# Patient Record
Sex: Male | Born: 1985 | Race: Black or African American | Hispanic: No | Marital: Single | Smoking: Current every day smoker
Health system: Southern US, Community
[De-identification: ages and names within clinical notes are randomized; demographics above are authoritative.]

## PROBLEM LIST (undated history)

## (undated) DIAGNOSIS — J939 Pneumothorax, unspecified: Secondary | ICD-10-CM

## (undated) DIAGNOSIS — T148XXA Other injury of unspecified body region, initial encounter: Secondary | ICD-10-CM

## (undated) DIAGNOSIS — W3400XA Accidental discharge from unspecified firearms or gun, initial encounter: Secondary | ICD-10-CM

## (undated) DIAGNOSIS — Y249XXA Unspecified firearm discharge, undetermined intent, initial encounter: Secondary | ICD-10-CM

---

## 2013-01-31 ENCOUNTER — Emergency Department (HOSPITAL_COMMUNITY): Payer: Self-pay

## 2013-01-31 ENCOUNTER — Emergency Department (HOSPITAL_COMMUNITY)
Admission: EM | Admit: 2013-01-31 | Discharge: 2013-01-31 | Disposition: A | Discharge status: Home / Self Care | Payer: Self-pay | Attending: Emergency Medicine | Admitting: Emergency Medicine

## 2013-01-31 ENCOUNTER — Encounter (HOSPITAL_COMMUNITY): Payer: Self-pay | Admitting: Emergency Medicine

## 2013-01-31 DIAGNOSIS — IMO0002 Reserved for concepts with insufficient information to code with codable children: Secondary | ICD-10-CM | POA: Insufficient documentation

## 2013-01-31 DIAGNOSIS — Z8709 Personal history of other diseases of the respiratory system: Secondary | ICD-10-CM | POA: Insufficient documentation

## 2013-01-31 DIAGNOSIS — W540XXA Bitten by dog, initial encounter: Secondary | ICD-10-CM | POA: Insufficient documentation

## 2013-01-31 DIAGNOSIS — S61509A Unspecified open wound of unspecified wrist, initial encounter: Secondary | ICD-10-CM | POA: Insufficient documentation

## 2013-01-31 DIAGNOSIS — S41009A Unspecified open wound of unspecified shoulder, initial encounter: Secondary | ICD-10-CM | POA: Insufficient documentation

## 2013-01-31 DIAGNOSIS — F17200 Nicotine dependence, unspecified, uncomplicated: Secondary | ICD-10-CM | POA: Insufficient documentation

## 2013-01-31 DIAGNOSIS — Y9389 Activity, other specified: Secondary | ICD-10-CM | POA: Insufficient documentation

## 2013-01-31 DIAGNOSIS — Z87828 Personal history of other (healed) physical injury and trauma: Secondary | ICD-10-CM | POA: Insufficient documentation

## 2013-01-31 DIAGNOSIS — Z23 Encounter for immunization: Secondary | ICD-10-CM | POA: Insufficient documentation

## 2013-01-31 DIAGNOSIS — F911 Conduct disorder, childhood-onset type: Secondary | ICD-10-CM | POA: Insufficient documentation

## 2013-01-31 DIAGNOSIS — Y9289 Other specified places as the place of occurrence of the external cause: Secondary | ICD-10-CM | POA: Insufficient documentation

## 2013-01-31 HISTORY — DX: Pneumothorax, unspecified: J93.9

## 2013-01-31 HISTORY — DX: Other injury of unspecified body region, initial encounter: T14.8XXA

## 2013-01-31 HISTORY — DX: Accidental discharge from unspecified firearms or gun, initial encounter: W34.00XA

## 2013-01-31 HISTORY — DX: Unspecified firearm discharge, undetermined intent, initial encounter: Y24.9XXA

## 2013-01-31 MED ORDER — AMOXICILLIN-POT CLAVULANATE 875-125 MG PO TABS: 1.0000 | ORAL_TABLET | Freq: Two times a day (BID) | ORAL | Status: AC

## 2013-01-31 MED ORDER — TETANUS-DIPHTH-ACELL PERTUSSIS 5-2.5-18.5 LF-MCG/0.5 IM SUSP
0.5000 mL | Freq: Once | INTRAMUSCULAR | Status: AC
  Administered 2013-01-31: 0.5 mL via INTRAMUSCULAR
  Filled 2013-01-31: qty 0.5
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
## 2013-01-31 NOTE — ED Provider Notes (Signed)
Medical screening examination/treatment/procedure(s) were performed by non-physician practitioner and as supervising physician I was immediately available for consultation/collaboration.  Gilda Crease, MD 01/31/13 [phone]
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
## 2021-02-27 ENCOUNTER — Encounter (HOSPITAL_BASED_OUTPATIENT_CLINIC_OR_DEPARTMENT_OTHER): Payer: Self-pay

## 2021-02-27 ENCOUNTER — Emergency Department (HOSPITAL_BASED_OUTPATIENT_CLINIC_OR_DEPARTMENT_OTHER)
Admission: EM | Admit: 2021-02-27 | Discharge: 2021-02-27 | Disposition: A | Discharge status: Home / Self Care | Payer: Medicaid Other | Attending: Emergency Medicine | Admitting: Emergency Medicine

## 2021-02-27 ENCOUNTER — Other Ambulatory Visit: Payer: Self-pay

## 2021-02-27 DIAGNOSIS — S0990XA Unspecified injury of head, initial encounter: Secondary | ICD-10-CM

## 2021-02-27 DIAGNOSIS — Y92410 Unspecified street and highway as the place of occurrence of the external cause: Secondary | ICD-10-CM | POA: Insufficient documentation

## 2021-02-27 DIAGNOSIS — M25531 Pain in right wrist: Secondary | ICD-10-CM | POA: Diagnosis not present

## 2021-02-27 DIAGNOSIS — F17210 Nicotine dependence, cigarettes, uncomplicated: Secondary | ICD-10-CM | POA: Insufficient documentation

## 2021-02-27 NOTE — Discharge Instructions (Addendum)
Head Injury You have been seen today for a head injury, which may or may not have resulted in a concussion. It does not appear to be serious at this time, however, it is important to note that your presentation today is not necessarily an indication of the severity of future symptoms.  Expected symptoms: Expected symptoms of concussion and/or head injury can include nausea, headache, mild dizziness (should still be able to get up and walk around without difficulty), difficulty concentrating, increased sleep, difficulty sleeping, increased intensity of emotions. Close observation: The close observation period is usually 6 hours from the injury. This includes staying awake and having a trustworthy adult monitor you to assure your condition does not worsen. You should be in regular contact with this person and ideally, they should be able to monitor you in person.  Secondary observation: The secondary observation period is usually 24 hours from the injury. You are allowed to sleep during this time. A trustworthy adult should intermittently monitor you to assure your condition does not worsen.   Overall head injury/concussion care: Rest: Be sure to get plenty of rest. You will need more rest and sleep while you recover. Hydration: Be sure to stay well hydrated by having a goal of drinking about 0.5 liters of water an hour. Pain:  Antiinflammatory medications: Take 600 mg of ibuprofen every 6 hours or 440 mg (over the counter dose) to 500 mg (prescription dose) of naproxen every 12 hours or for the next 3 days. After this time, these medications may be used as needed for pain. Take these medications with food to avoid upset stomach. Choose only one of these medications, do not take them together. Tylenol: Should you continue to have additional pain while taking the ibuprofen or naproxen, you may add in tylenol as needed. Your daily total maximum amount of tylenol from all sources should be limited to  4000mg /day for persons without liver problems, or 2000mg /day for those with liver problems. Return to sports and activities: In general, you may return to normal activities once symptoms have subsided, however, you would ideally be cleared by a primary care provider or other qualified medical professional prior to return to these activities.  Follow up: Follow up with the concussion clinic or your primary care provider for further management of this issue. Return: Return to the ED should you begin to have confusion, abnormal behavior, aggression, violence, or personality changes, repeated vomiting, vision loss, numbness or weakness on one side of the body, difficulty standing due to dizziness, significantly worsening pain, or any other major concerns.  You have been seen today for a wrist injury.  Antiinflammatory medications: Take 600 mg of ibuprofen every 6 hours or 440 mg (over the counter dose) to 500 mg (prescription dose) of naproxen every 12 hours for the next 3 days. After this time, these medications may be used as needed for pain. Take these medications with food to avoid upset stomach. Choose only one of these medications, do not take them together. Acetaminophen (generic for Tylenol): Should you continue to have additional pain while taking the ibuprofen or naproxen, you may add in acetaminophen as needed. Your daily total maximum amount of acetaminophen from all sources should be limited to 4000mg /day for persons without liver problems, or 2000mg /day for those with liver problems. Ice: May apply ice to the area over the next 24 hours for 15 minutes at a time to reduce swelling. Elevation: Keep the extremity elevated as often as possible to reduce pain and  inflammation. Support: Wear the wrist brace for support and comfort. Wear this until pain resolves. Exercises: Start by performing these exercises a few times a week, increasing the frequency until you are performing them twice daily.   Follow up: If symptoms are improving, you may follow up with your primary care provider for any continued management. If symptoms are not starting to improve within a week, you should follow up with the orthopedic specialist within two weeks. Return: Return to the ED for numbness, weakness, increasing pain, overall worsening symptoms, loss of function, or if symptoms are not improving, you have tried to follow up with the orthopedic specialist, and have been unable to do so.  For prescription assistance, may try using prescription discount sites or apps, such as goodrx.com or Good Rx smart phone app.
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
## 2021-12-03 ENCOUNTER — Emergency Department (HOSPITAL_COMMUNITY): Payer: Medicaid Other

## 2021-12-03 ENCOUNTER — Emergency Department (HOSPITAL_COMMUNITY)
Admission: EM | Admit: 2021-12-03 | Discharge: 2021-12-03 | Disposition: A | Discharge status: Home / Self Care | Payer: Medicaid Other | Attending: Emergency Medicine | Admitting: Emergency Medicine

## 2021-12-03 ENCOUNTER — Encounter (HOSPITAL_COMMUNITY): Payer: Self-pay

## 2021-12-03 DIAGNOSIS — S50311A Abrasion of right elbow, initial encounter: Secondary | ICD-10-CM | POA: Diagnosis not present

## 2021-12-03 DIAGNOSIS — T07XXXA Unspecified multiple injuries, initial encounter: Secondary | ICD-10-CM

## 2021-12-03 DIAGNOSIS — S40211A Abrasion of right shoulder, initial encounter: Secondary | ICD-10-CM | POA: Diagnosis not present

## 2021-12-03 DIAGNOSIS — S5001XA Contusion of right elbow, initial encounter: Secondary | ICD-10-CM | POA: Diagnosis not present

## 2021-12-03 DIAGNOSIS — S60512A Abrasion of left hand, initial encounter: Secondary | ICD-10-CM | POA: Insufficient documentation

## 2021-12-03 DIAGNOSIS — S59901A Unspecified injury of right elbow, initial encounter: Secondary | ICD-10-CM | POA: Diagnosis present

## 2021-12-03 MED ORDER — LORATADINE 10 MG PO TABS
10.0000 mg | ORAL_TABLET | Freq: Once | ORAL | Status: AC
  Administered 2021-12-03: 10 mg via ORAL
  Filled 2021-12-03: qty 1

## 2021-12-03 MED ORDER — ACETAMINOPHEN 325 MG PO TABS
650.0000 mg | ORAL_TABLET | Freq: Once | ORAL | Status: AC
  Administered 2021-12-03: 650 mg via ORAL
  Filled 2021-12-03: qty 2

## 2021-12-03 NOTE — ED Notes (Signed)
An After Visit Summary was printed and given to the patient. ?Discharge instructions given and no further questions at this time.  ?Pt given food as requested. Pt ambulatory.  ?

## 2021-12-03 NOTE — Discharge Instructions (Signed)
Please refer to the attached instructions. Follow-up with your primary care provider. 
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
## 2022-12-06 IMAGING — CR DG ELBOW COMPLETE 3+V*R*
4 series · 4 of 4 positions shown · non-contrast
Comparison: None.

CLINICAL DATA: Fall.

EXAM:
RIGHT ELBOW - COMPLETE 3+ VIEW

[x elbow ap right]
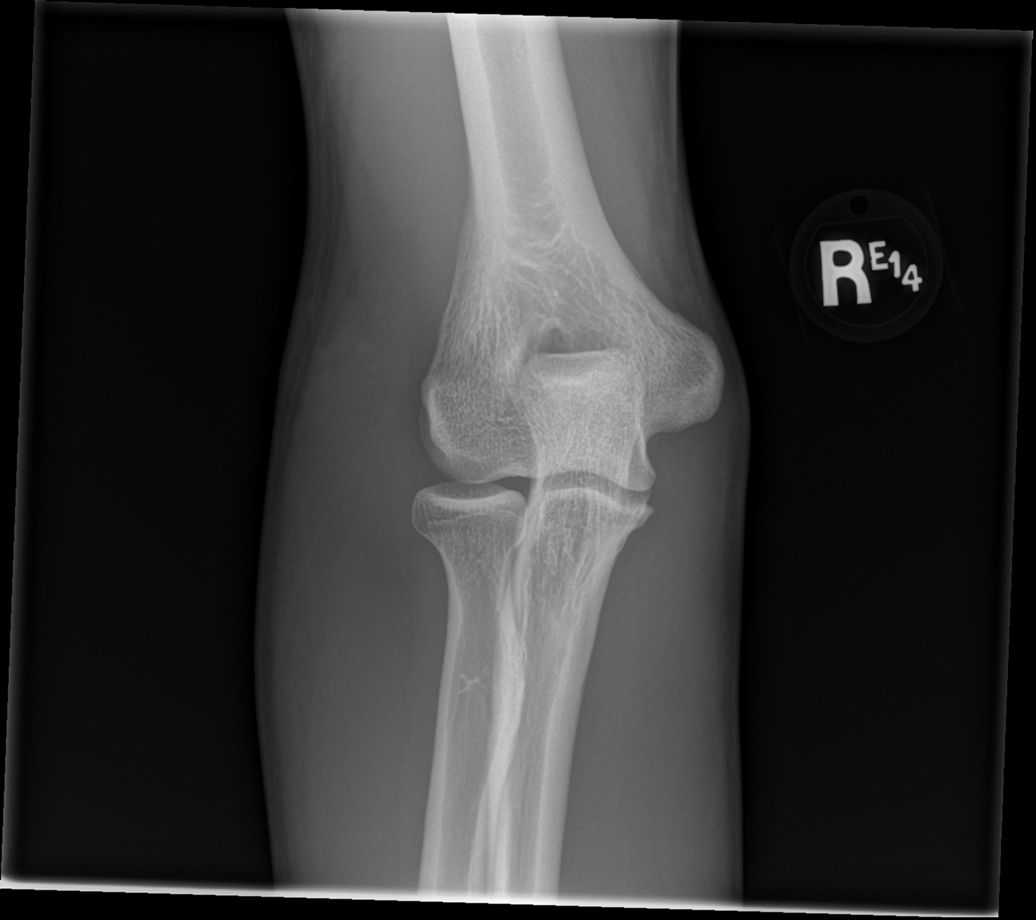

[x elbow obl right (1 of 2)]
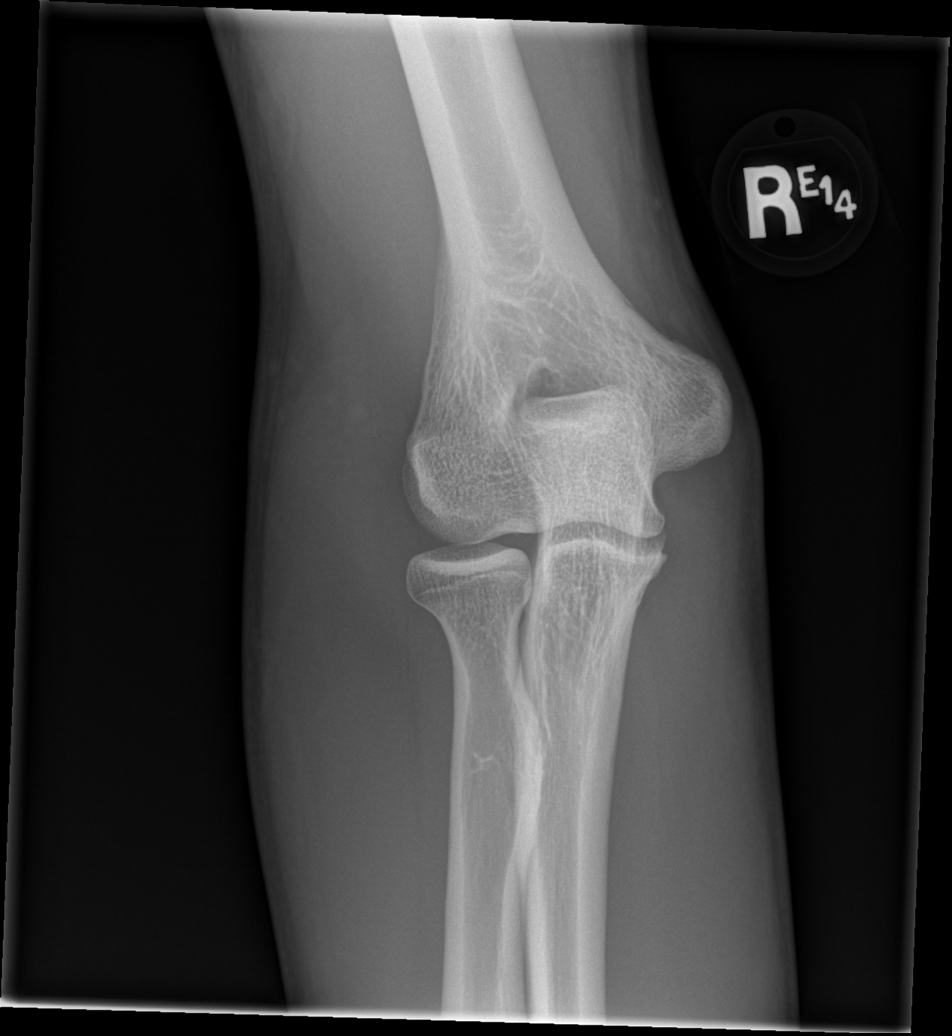

[x elbow obl right (2 of 2)]
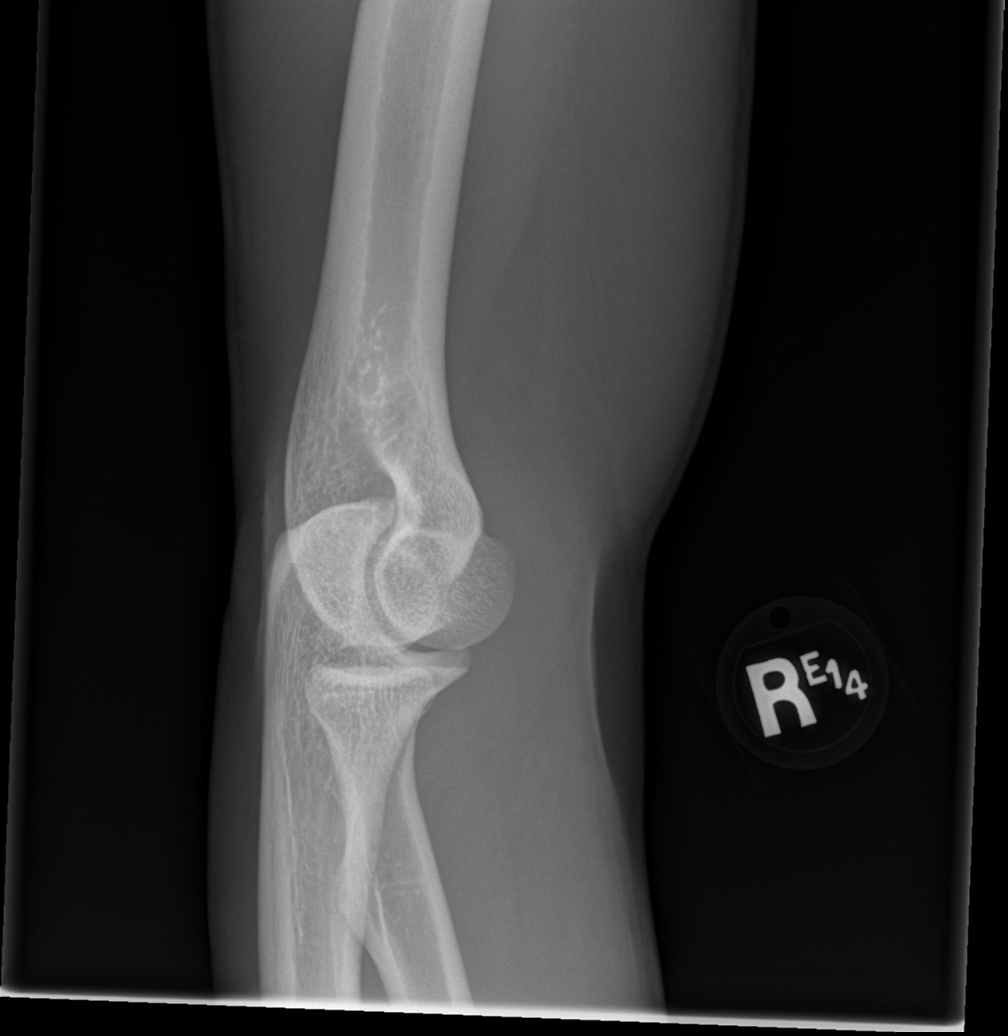

[x elbow lat right]
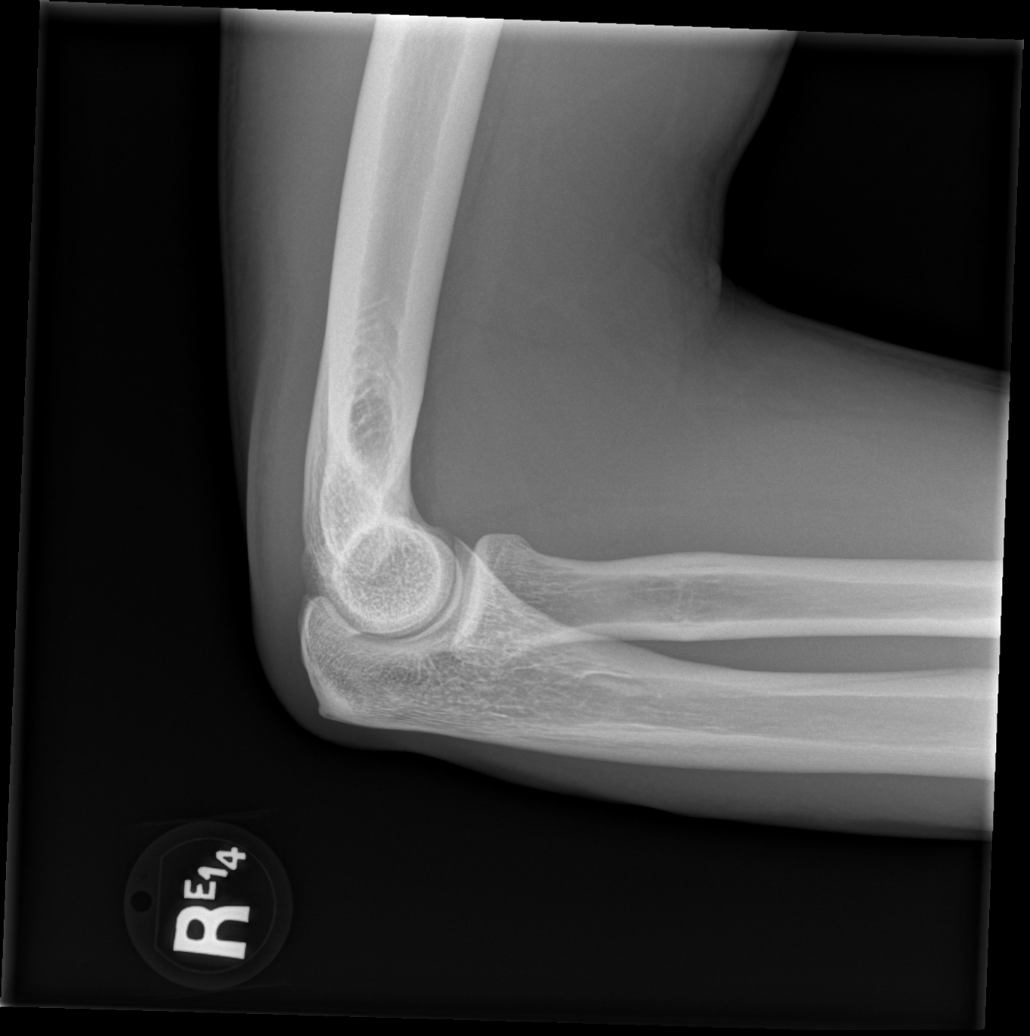

[4 of 4 positions shown; findings below may reference images not displayed]

FINDINGS: There is no evidence of fracture, dislocation, or joint effusion.
There is no evidence of arthropathy or other focal bone abnormality.
There is posterior elbow soft tissue swelling.
IMPRESSION: No acute fracture or malalignment.
